# Patient Record
Sex: Male | Born: 1935 | Race: White | Hispanic: No | Marital: Married
Health system: Southern US, Community
[De-identification: ages and names within clinical notes are randomized; demographics above are authoritative.]

---

## 2004-07-18 ENCOUNTER — Ambulatory Visit: Payer: Self-pay | Admitting: Cardiology

## 2004-08-16 ENCOUNTER — Ambulatory Visit: Payer: Self-pay | Admitting: Cardiology

## 2004-08-24 ENCOUNTER — Ambulatory Visit: Payer: Self-pay | Admitting: *Deleted

## 2004-08-24 ENCOUNTER — Ambulatory Visit: Payer: Self-pay | Admitting: Cardiology

## 2004-09-07 ENCOUNTER — Ambulatory Visit: Payer: Self-pay | Admitting: Cardiology

## 2004-10-05 ENCOUNTER — Ambulatory Visit: Payer: Self-pay | Admitting: Cardiology

## 2004-11-02 ENCOUNTER — Ambulatory Visit: Payer: Self-pay | Admitting: Cardiology

## 2004-11-30 ENCOUNTER — Ambulatory Visit: Payer: Self-pay | Admitting: *Deleted

## 2004-12-29 ENCOUNTER — Ambulatory Visit: Payer: Self-pay | Admitting: Cardiology

## 2005-01-12 ENCOUNTER — Ambulatory Visit: Payer: Self-pay | Admitting: Internal Medicine

## 2005-02-09 ENCOUNTER — Ambulatory Visit: Payer: Self-pay | Admitting: Cardiology

## 2005-03-08 ENCOUNTER — Ambulatory Visit: Payer: Self-pay | Admitting: Internal Medicine

## 2005-03-28 ENCOUNTER — Ambulatory Visit: Payer: Self-pay | Admitting: Cardiology

## 2005-04-25 ENCOUNTER — Ambulatory Visit: Payer: Self-pay | Admitting: Cardiology

## 2005-05-25 ENCOUNTER — Ambulatory Visit: Payer: Self-pay | Admitting: Cardiology

## 2005-06-19 ENCOUNTER — Ambulatory Visit: Payer: Self-pay | Admitting: Cardiology

## 2005-06-19 ENCOUNTER — Ambulatory Visit: Payer: Self-pay

## 2005-07-10 ENCOUNTER — Ambulatory Visit: Payer: Self-pay | Admitting: Internal Medicine

## 2005-07-24 ENCOUNTER — Ambulatory Visit: Payer: Self-pay | Admitting: Cardiology

## 2005-08-07 ENCOUNTER — Ambulatory Visit: Payer: Self-pay | Admitting: Cardiology

## 2005-08-21 ENCOUNTER — Ambulatory Visit: Payer: Self-pay | Admitting: Cardiology

## 2005-09-18 ENCOUNTER — Ambulatory Visit: Payer: Self-pay | Admitting: Cardiology

## 2005-10-16 ENCOUNTER — Ambulatory Visit: Payer: Self-pay | Admitting: Internal Medicine

## 2005-10-20 ENCOUNTER — Ambulatory Visit: Payer: Self-pay | Admitting: Cardiology

## 2005-10-26 ENCOUNTER — Ambulatory Visit: Admission: RE | Admit: 2005-10-26 | Discharge: 2005-10-26 | Payer: Self-pay | Admitting: Cardiology

## 2005-11-13 ENCOUNTER — Ambulatory Visit: Payer: Self-pay | Admitting: Cardiology

## 2005-12-11 ENCOUNTER — Ambulatory Visit: Payer: Self-pay | Admitting: Cardiology

## 2005-12-25 ENCOUNTER — Ambulatory Visit: Payer: Self-pay | Admitting: Internal Medicine

## 2006-01-22 ENCOUNTER — Ambulatory Visit: Payer: Self-pay | Admitting: Internal Medicine

## 2006-02-19 ENCOUNTER — Ambulatory Visit: Payer: Self-pay | Admitting: Cardiology

## 2006-03-12 ENCOUNTER — Ambulatory Visit: Payer: Self-pay | Admitting: Internal Medicine

## 2006-04-09 ENCOUNTER — Ambulatory Visit: Payer: Self-pay | Admitting: Internal Medicine

## 2006-05-08 ENCOUNTER — Ambulatory Visit: Payer: Self-pay | Admitting: Cardiovascular Disease

## 2006-05-08 ENCOUNTER — Ambulatory Visit: Payer: Self-pay | Admitting: Cardiology

## 2006-06-05 ENCOUNTER — Ambulatory Visit: Payer: Self-pay | Admitting: *Deleted

## 2006-07-03 ENCOUNTER — Ambulatory Visit: Payer: Self-pay | Admitting: Cardiology

## 2006-07-18 ENCOUNTER — Ambulatory Visit: Payer: Self-pay | Admitting: Cardiology

## 2006-07-18 ENCOUNTER — Ambulatory Visit: Payer: Self-pay | Admitting: Internal Medicine

## 2006-08-08 ENCOUNTER — Ambulatory Visit: Payer: Self-pay | Admitting: Cardiology

## 2006-09-06 ENCOUNTER — Ambulatory Visit: Payer: Self-pay | Admitting: Cardiology

## 2006-10-04 ENCOUNTER — Ambulatory Visit: Payer: Self-pay | Admitting: Cardiology

## 2006-10-09 ENCOUNTER — Ambulatory Visit: Payer: Self-pay | Admitting: Cardiology

## 2006-11-01 ENCOUNTER — Ambulatory Visit: Payer: Self-pay | Admitting: *Deleted

## 2006-11-29 ENCOUNTER — Ambulatory Visit: Payer: Self-pay | Admitting: Cardiology

## 2007-01-01 ENCOUNTER — Ambulatory Visit: Payer: Self-pay | Admitting: Cardiology

## 2007-01-01 LAB — CONVERTED CEMR LAB
Calcium: 9.9 mg/dL (ref 8.4–10.5)
Chloride: 106 meq/L (ref 96–112)
GFR calc Af Amer: 56 mL/min
GFR calc non Af Amer: 46 mL/min
Magnesium: 2.4 mg/dL (ref 1.5–2.5)
Sodium: 140 meq/L (ref 135–145)

## 2011-01-20 NOTE — Assessment & Plan Note (Signed)
[facility]                            CARDIOLOGY OFFICE NOTE   NAME:Cesar Richardson, Cesar Richardson                        MRN:          [id number]  DATE:01/01/2007                            DOB:          Nov 04, 1935    PRIMARY CARE PHYSICIAN:  Dr. Elana Alm. Rostand in [place].   PRIMARY CARDIOLOGIST:  Everardo Beals. Juanda Chance, MD, [facility]   PATIENT PROFILE:  A Cesar Richardson, [name] with prior history of  atrial fibrillation and SVT ablation, who presents with recent onset of  irregular heart rhythm and skipped beats.   PROBLEM LIST:  1. History of paroxysmal atrial fibrillation on amiodarone therapy.  2. History of SVT status post ablation at [facility] in 2005.  3. Hypertension.  4. Hyperlipidemia.  5. History of normal echo and Myoview scans.  6. History of elevated PSA with negative prostate biopsy.   HISTORY OF PRESENT ILLNESS:  A Cesar Richardson, [name] with the  above problem list who was last seen in clinic in February of 2008.  He  was in his usual state of health until approximately ten days ago when  while resting after dinner he noted his heart skipping beats without  associated symptoms, lasting 15 minutes and resolving spontaneously.  Since then, he has had symptoms almost daily with the exception of one  day, almost exclusively occurring at night after dinner, lasting 15 to  20 minutes, and resolving spontaneously.  Yesterday afternoon, symptoms  presented as skipped beats with heart rates in the 70's, lasting three  to four hours and resolving spontaneously.  He denied any chest pain,  shortness of breath, PND, orthopnea, dizziness, syncope, edema, or early  satiety.  Notably, his previous symptom during periods of prolonged  atrial fibrillation was weakness, which he has not yet experienced.  Because of the frequency of these palpitations, although rate  controlled, the patient presented today for evaluation.   CURRENT  MEDICATIONS:  1. Amiodarone 100 mg daily.  2. Lisinopril 40 mg two tabs daily.  3. Warfarin as directed.  4. Hydrochlorothiazide 25 mg daily.  5. Amlodipine 10 mg daily.  6. Lipitor 10 mg daily.   PHYSICAL EXAMINATION:  VITAL SIGNS:  Blood pressure 148/83, 150/85 on  repeat.  Heart rate 78.  Respirations 16.  Weight is 210 pounds.  GENERAL:  Pleasant, white male in no acute distress.  Awake, alert, and  oriented x3.  NECK:  No bruits, JVD.  LUNGS:  Respirations regular, unlabored.  Clear to auscultation.  CARDIAC:  Regular S1, S2.  No S3, S4, murmurs.  ABDOMEN:  Round, soft, nontender, nondistended.  Bowel sounds present  x4.  EXTREMITIES:  Warm, dry, pink.  No cyanosis, clubbing, or edema.  Dorsalis pedis, posterior tibial pulses 2+ and equal bilaterally.   ACCESSORY CLINICAL FINDINGS:  EKG shows sinus rhythm with a first degree  AV block and a right bundle branch block but otherwise no acute ST or T  changes.  A basic metabolic panel, magnesium, TSH, echo, and 48-hour  Holter are pending.   ASSESSMENT/PLAN:  1. Palpitations.  The patient  reports at least a ten day history of      almost daily sensation of irregular heartbeats and skipped beats.      It is not clear if this is atrial fibrillation or not as he has no      associated symptoms and previously has had weakness during periods      of atrial fibrillation.  We will obtain basic metabolic panel,      magnesium, TSH, 48-hour Holter, and echocardiogram, and arrange for      followup in the next two to three weeks with Dr. Juanda Chance.  2. Paroxysmal atrial fibrillation.  The patient had previously been      well maintained on amiodarone until recently when he started having      palpitations.  He is in sinus rhythm today.  It is not clear      whether or not he is having some more paroxysmal atrial      fibrillation, or either premature atrial contractions or premature      ventricular contractions.  Hopefully, the Holter  monitor will help      to determine what is going on.  He remains on Coumadin therapy      otherwise.  3. Hypertension.  Blood pressure is elevated today.  He is on high      dose lisinopril as well as hydrochlorothiazide and amlodipine.  He      says that normally at home or during other medical doctor visits,      his pressure runs in the 120s to 130s.  I asked him to keep a log      of his blood pressures over the next week and to bring this with      him on his next visit as he may require additional      antihypertensives.  In the setting of palpitations, could consider      adding a beta blocker.  4. Hyperlipidemia.  He remains on statin therapy, which he tolerates      well.  He did have lipids in February of 2008, showing a total      cholesterol of 186 and an LDL of 103.  He does not have a history      of coronary disease.  5. Disposition:  Lab work, echocardiogram, and Holter as above.      Follow up with Dr. Juanda Chance in two to three weeks.      Nicolasa Ducking, ANP  Electronically Signed      Luis Abed, MD, [facility]  Electronically Signed   CB/MedQ  DD: 01/01/2007  DT: 01/01/2007  Job #: [id number]

## 2011-01-20 NOTE — Assessment & Plan Note (Signed)
[facility]                           ELECTROPHYSIOLOGY OFFICE NOTE   NAME:Cesar Richardson, Cesar Richardson                        MRN:          [id number]  DATE:07/18/2006                            DOB:          1936/06/25    Cesar Richardson is referred today by Dr. Charlies Constable for additional evaluation  for atrial fibrillation and consideration for catheter ablation.   HISTORY OF PRESENT ILLNESS:  The patient is a very pleasant, 75 year old man  with a history atrial fibrillation dating back intermittently into his 63s.  The patient states though that in the last 2 years, his episodes of atrial  fibrillation have increased in frequency and severity.  His history is that  back in 2005, he underwent catheter ablation of a right atrial tachycardia  by Dr. __________ in [place].  The patient, however, developed atrial  fibrillation and it is unclear how he did not end up following up with Dr.  __________ but is on amiodarone and on just low-dose of amiodarone, has had  no symptoms of atrial fibrillation and has tolerated this medication quite  nicely.  He denies chest pain, shortness of breath, or other symptoms from  his amiodarone therapy.   PAST MEDICAL HISTORY:  Notable for hypertension and dyslipidemia with his  hypertension being fairly severe.   MEDICATIONS:  Include:  1. Amiodarone 100 daily.  2. Lisinopril 40 mg daily.  3. Warfarin as directed.  4. Hydrochlorothiazide 25 daily.  5. Amlodipine 10 mg daily.  6. Lipitor 10 mg daily.   FAMILY HISTORY:  Notable for a brother with atrial fibrillation who recently  underwent ablation in [place].  He has a father who died at age 73 and a  mother who died at 59 of cancer.  He has 2 sisters with diabetes.   SOCIAL HISTORY:  The patient is a retired Licensed conveyancer, lives in  [place].  His son-in-law is a Teacher, early years/pre at [employer].  He  denies tobacco or ethanol abuse.   REVIEW OF SYSTEMS:   Negative except as noted in the HPI.   PHYSICAL EXAM:  He is a pleasant, well-appearing, 75 year old man in no  acute distress.  The blood pressure today was 142/78. The pulse 78 and  regular.  The respirations were 18.  Weight was 200 pounds.  HEENT:  Normal.  NECK:  No jugular venous distention.  There is no thyromegaly.  The trachea  was midline. The carotids were 2+ and symmetric.  There was no thyromegaly.  LUNGS:  Clear bilaterally to auscultation.  There are no wheezes, rales, or  rhonchi.  There is no increased work of breathing.  CARDIOVASCULAR:  Regular rate and rhythm with normal S1 and a split S2,  which was physiologically split.  There were no obvious murmurs.  The PMI  was not enlarged nor was it laterally displaced.  ABDOMEN:  Soft, nontender, nondistended.  There was no organomegaly.  The  bowel sounds are present.  There is no rebound or guarding.  EXTREMITIES:  No cyanosis, clubbing, or edema.  The pulses were 2+ and  symmetric.  NEUROLOGIC:  Alert and oriented x3 with cranial nerves intact.  Strength was  5/5 and symmetric.  SKIN:  Normal.  JOINT: Normal.   The EKG demonstrates sinus rhythm with a right bundle branch block and QRS  duration of 150 msec with first-degree AV block.   IMPRESSION:  1. Paroxysmal atrial fibrillation.  2. History of atrial tachycardia status post ablation.  3. Severe hypertension, now controlled on multiple medications.  4. Chronic Coumadin therapy.   DISCUSSION:  Today we spent a lot of time talking about the treatment  options with Cesar Richardson, specifically consideration for catheter ablation of  his atrial fibrillation.  Because the patient has tolerated amiodarone very  nicely and because he has not had any recurrent symptomatic atrial  fibrillation on very loses of amiodarone, I would recommend a continued  period of amiodarone therapy rather than proceeding with ablation.  We will  plan to see him back as needed.     Doylene Canning. Ladona Ridgel, MD  Electronically Signed    GWT/MedQ  DD: 07/18/2006  DT: 07/18/2006  Job #: [id number]   cc:   Rocky Morel

## 2011-01-20 NOTE — Assessment & Plan Note (Signed)
[facility]                            CARDIOLOGY OFFICE NOTE   NAME:Cesar Richardson, Cesar Richardson                        MRN:          [id number]  DATE:10/09/2006                            DOB:          [birth year]    PRIMARY CARE PHYSICIAN:  Molly Maduro A. Daryll Brod, MD in [facility]   CLINICAL HISTORY:  Cesar Richardson is 75 years old and has a history of SVT  and paroxysmal atrial fibrillation. He had an ablation procedure in [facility] for the SVT and his atrial fibrillation has now been controlled on  amiodarone. He has been evaluated for underlying heart disease with a  Cardiolite scan and echocardiogram, both of which were normal.   He says he has done quite well over the past year. He just has  occasional palpitations, but nothing like the atrial fibrillation that  he had before. He has had no chest pain or shortness of breath.   PAST MEDICAL HISTORY:  Is significant for hypertension and  hyperlipidemia.   CURRENT MEDICATIONS:  1. Amiodarone.  2. Lisinopril.  3. Warfarin.  4. Hydrochlorothiazide.  5. Amlodipine.  6. Lipitor.   HE HAS BEEN INTOLERANT TO PRAVACHOL.   PHYSICAL EXAMINATION:  Blood pressure is 151/88, pulse is 74 and  regular. There was no venous distention. Carotid pulses were full  without bruits.  CHEST: Was clear.  CARDIAC: Rhythm was regular. Had no murmurs or gallops.  ABDOMEN: Soft, without organomegaly.  Peripheral pulses were full and there is no peripheral edema.   No electrocardiogram was done since he had one done last November.   IMPRESSION:  1. Paroxysmal atrial fibrillation, controlled on amiodarone.  2. History of previous supraventricular tachycardia, status post      ablation at [facility] in 2005.  3. Normal echocardiogram and Myoview scans.  4. Hypertension.  5. Hyperlipidemia.  6. Elevated PSA with negative prostate biopsy.   RECOMMENDATIONS:  I think Cesar Richardson is doing quite well. Will plan to  get blood  studies for amiodarone surveillance at [facility] office if  this is possible. Will get a TSH and liver function test along with his  cholesterol that was already planned. Also, he had a BNP and CBC. Will  get another TSH and liver function test in six months and I will see him  back in a year.   ADDENDUM:  Dr. Lewayne Richardson saw Cesar Richardson last November for an EP opinion  regarding possible atrial fibrillation ablation. This was done at the  family's request. Dr. Ladona Richardson felt that since he was controlled very well  on low dose amiodarone that he did not recommend an attempt at atrial  fibrillation ablation and recommended continuing his current therapy.     Cesar Elvera Lennox Juanda Chance, MD, [facility]  Electronically Signed    BRB/MedQ  DD: 10/09/2006  DT: 10/09/2006  Job #: [id number]

## 2019-04-02 ENCOUNTER — Other Ambulatory Visit (HOSPITAL_COMMUNITY): Payer: Self-pay | Admitting: Urology

## 2019-04-02 DIAGNOSIS — D136 Benign neoplasm of pancreas: Secondary | ICD-10-CM

## 2019-04-15 ENCOUNTER — Other Ambulatory Visit (HOSPITAL_COMMUNITY): Payer: Self-pay | Admitting: Urology

## 2019-04-15 DIAGNOSIS — D136 Benign neoplasm of pancreas: Secondary | ICD-10-CM

## 2019-04-16 ENCOUNTER — Ambulatory Visit (HOSPITAL_COMMUNITY)
Admission: RE | Admit: 2019-04-16 | Discharge: 2019-04-16 | Disposition: A | Discharge status: Home / Self Care | Payer: Medicare Other | Source: Ambulatory Visit | Attending: Urology | Admitting: Urology

## 2019-04-16 ENCOUNTER — Other Ambulatory Visit: Payer: Self-pay

## 2019-04-16 DIAGNOSIS — D136 Benign neoplasm of pancreas: Secondary | ICD-10-CM | POA: Insufficient documentation

## 2019-04-16 LAB — POCT I-STAT CREATININE: Creatinine, Ser: 1.1 mg/dL (ref 0.61–1.24)

## 2019-04-16 MED ORDER — GADOBUTROL 1 MMOL/ML IV SOLN
10.0000 mL | Freq: Once | INTRAVENOUS | Status: AC | PRN
  Administered 2019-04-16: 10 mL via INTRAVENOUS

## 2019-09-16 ENCOUNTER — Ambulatory Visit: Payer: Medicare Other | Attending: Internal Medicine

## 2019-09-16 DIAGNOSIS — Z23 Encounter for immunization: Secondary | ICD-10-CM | POA: Insufficient documentation

## 2019-09-16 NOTE — Progress Notes (Signed)
   Covid-19 Vaccination Clinic  Name:  Cesar Richardson    MRN: [id number] DOB: 03/22/1936  09/16/2019  Cesar Richardson was observed post Covid-19 immunization for 30 minutes based on pre-vaccination screening without incidence. He was provided with Vaccine Information Sheet and instruction to access the V-Safe system.   Cesar Richardson was instructed to call 911 with any severe reactions post vaccine: [name] Kitchen Difficulty breathing  . Swelling of your face and throat  . A fast heartbeat  . A bad rash all over your body  . Dizziness and weakness    Immunizations Administered    Name Date Dose VIS Date Route   Pfizer COVID-19 Vaccine 09/16/2019 12:25 PM 0.3 mL 08/15/2019 Intramuscular   Manufacturer: [place]   Lot: [id number]   [place]: [id number]

## 2019-10-04 ENCOUNTER — Ambulatory Visit: Payer: Medicare Other | Attending: Internal Medicine

## 2019-10-04 DIAGNOSIS — Z23 Encounter for immunization: Secondary | ICD-10-CM | POA: Insufficient documentation

## 2019-10-04 NOTE — Progress Notes (Signed)
   Covid-19 Vaccination Clinic  Name:  Cesar Richardson    MRN: [id number] DOB: [birth year]  10/04/2019  Mr. [name] was observed post Covid-19 immunization for 15 minutes without incidence. He was provided with Vaccine Information Sheet and instruction to access the V-Safe system.   Mr. Cord was instructed to call 911 with any severe reactions post vaccine: [name] Kitchen Difficulty breathing  . Swelling of your face and throat  . A fast heartbeat  . A bad rash all over your body  . Dizziness and weakness    Immunizations Administered    Name Date Dose VIS Date Route   Pfizer COVID-19 Vaccine 10/04/2019 12:14 PM 0.3 mL 08/15/2019 Intramuscular   Manufacturer: [place]   Lot: [id number]   [place]: [id number]

## 2021-03-16 IMAGING — MR MR 3D RECON AT SCANNER
9 of 15 series · 22 of 48 positions shown · IV contrast (gadavist)
Comparison: CT the abdomen and pelvis 03/14/2019.

CLINICAL DATA: 83-year-old male with history of cystic pancreatic
lesion. Follow-up study.

EXAM:
MRI ABDOMEN WITHOUT AND WITH CONTRAST
TECHNIQUE: Multiplanar multisequence MR imaging of the abdomen was performed
both before and after the administration of intravenous contrast.
CONTRAST:  10 mL of Gadavist.

[Series 3: T2 fat-sat · axial · 5.0mm · 0.78mm/px · 1 of 53 slices shown]
[im 1/53]
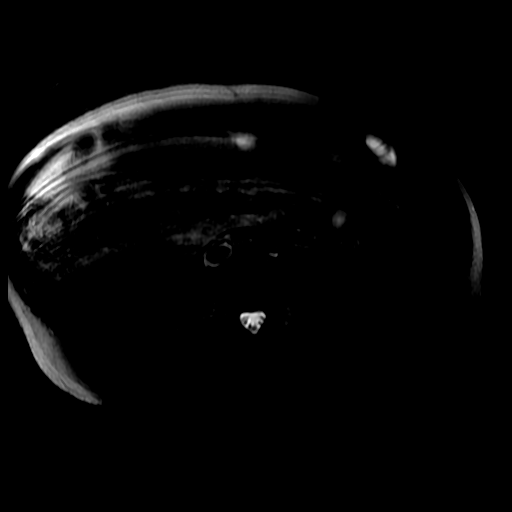

[Series 4: DWI b500 · axial · 6.0mm · 1.48mm/px · z∈[-69,+173]mm · 2 of 64 slices shown]
[im 1/64]
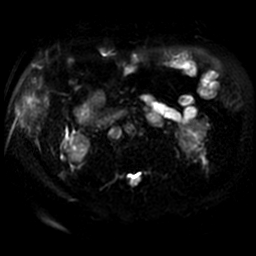
[im 64/64]
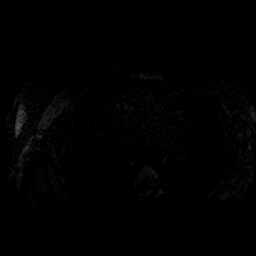

[Series 5: T2 · axial · 5.0mm · 0.78mm/px · z∈[-82,+153]mm · 2 of 48 slices shown (1 of 2)]
[im 1/48]
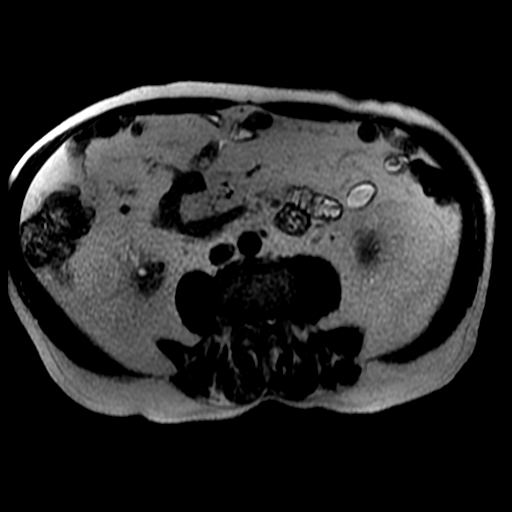
[im 48/48]
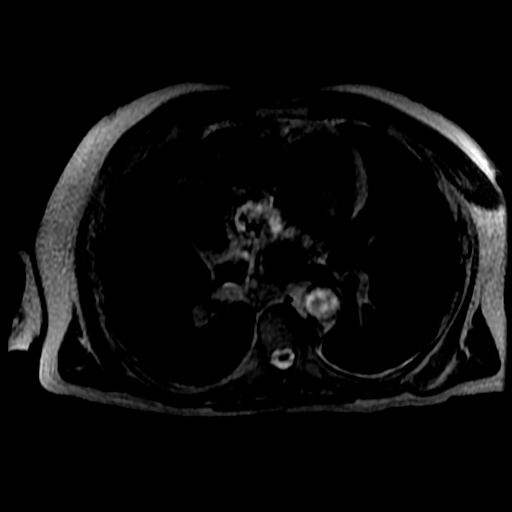

[Series 7: bSSFP · coronal · 5.0mm · 0.82mm/px · 2 of 47 slices shown]
[im 1/47]
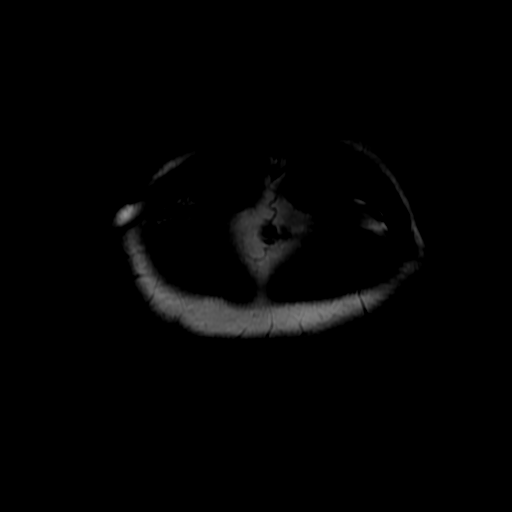
[im 47/47]
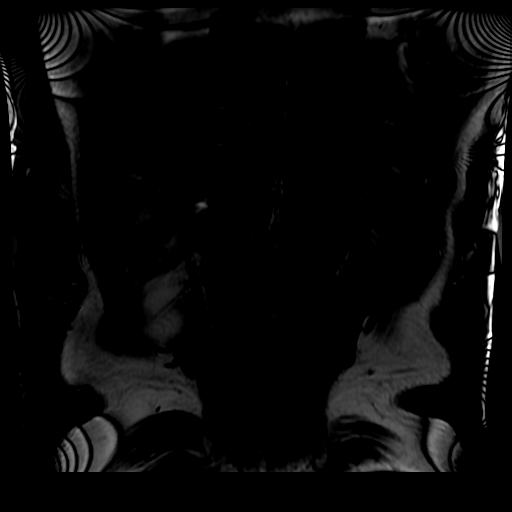

[Series 8: T2 · coronal · 5.0mm · 0.80mm/px · 2 of 49 slices shown (2 of 2)]
[im 1/49]
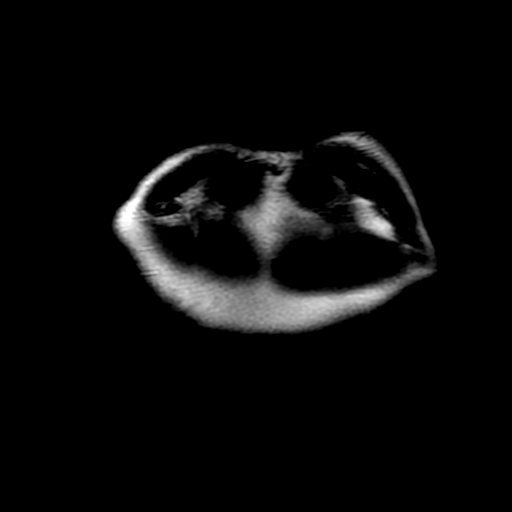
[im 49/49]
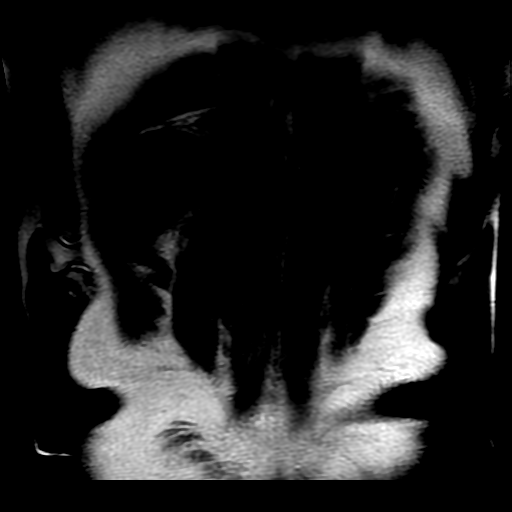

[Series 9: ax dualecho bh · axial · 5.0mm · 1.56mm/px · z∈[-82,+153]mm · 4 of 96 slices shown]
[im 1/96]
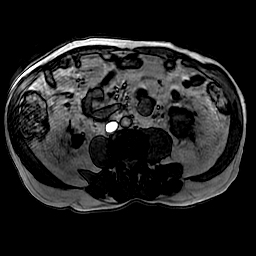
[im 32/96]
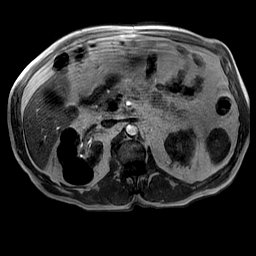
[im 64/96]
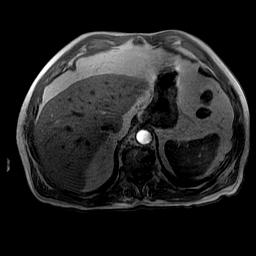
[im 96/96]
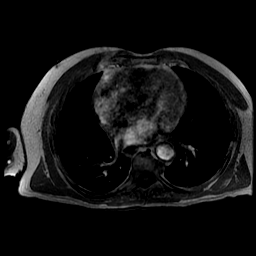

[Series 12: MRCP · axial · 40.0mm · 0.70mm/px · 1 of 6 slices shown]
[im 1/6]
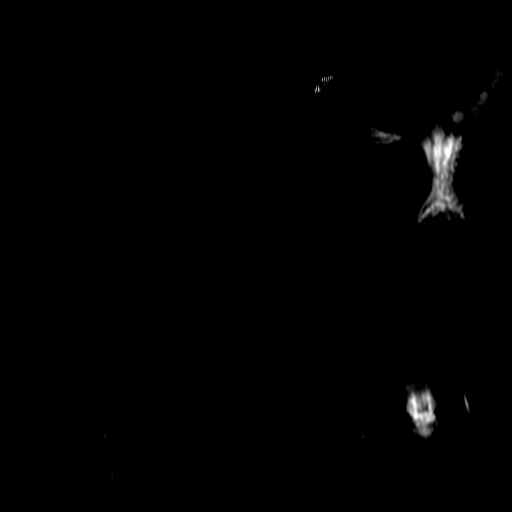

[Series 1300: T1 dynamic · axial · 5.0mm · 0.78mm/px · z∈[-84,+133]mm · 4 of 88 slices shown (1 of 2)]
[im 1/88]
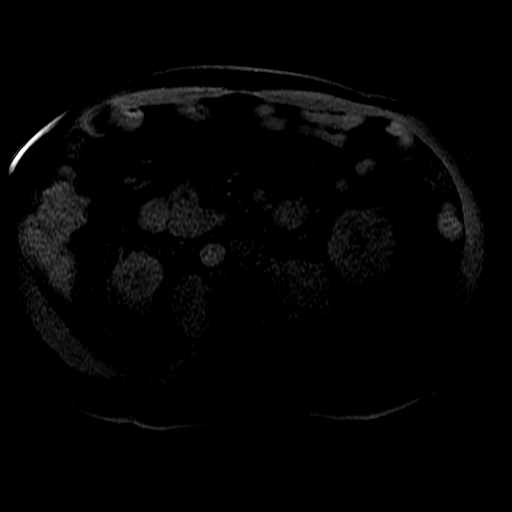
[im 30/88]
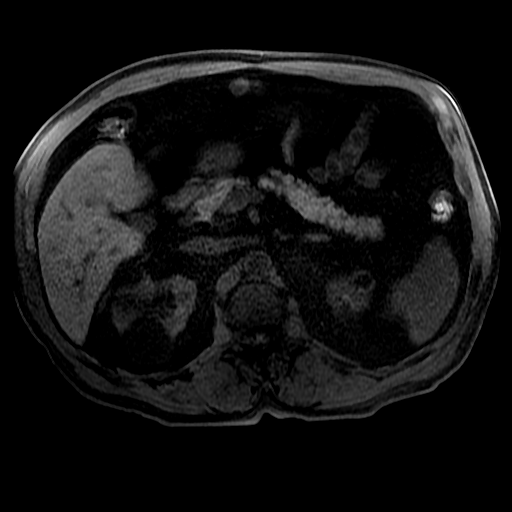
[im 59/88]
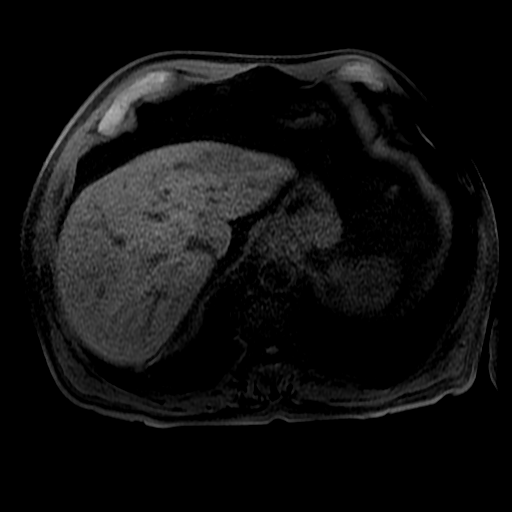
[im 88/88]
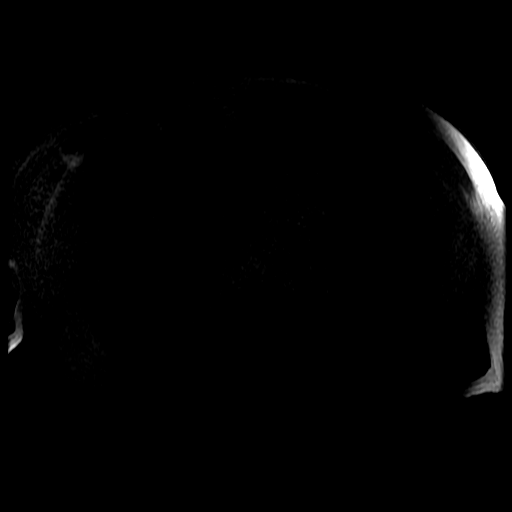

[Series 1301: T1 dynamic · axial · 5.0mm · 0.78mm/px · z∈[-84,+133]mm · 4 of 88 slices shown (2 of 2)]
[im 1/88]
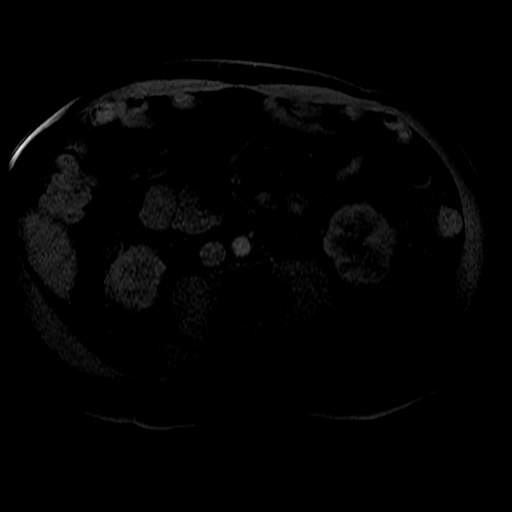
[im 30/88]
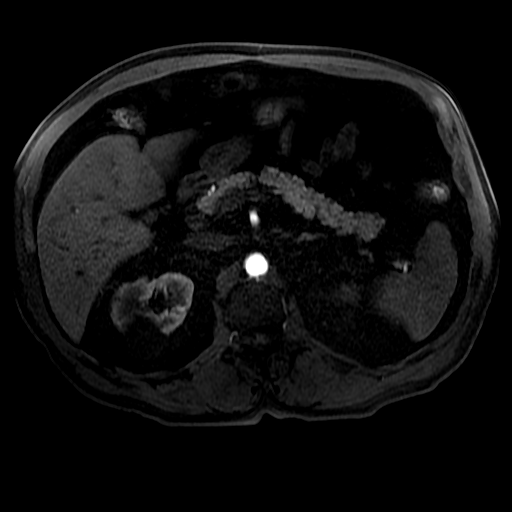
[im 59/88]
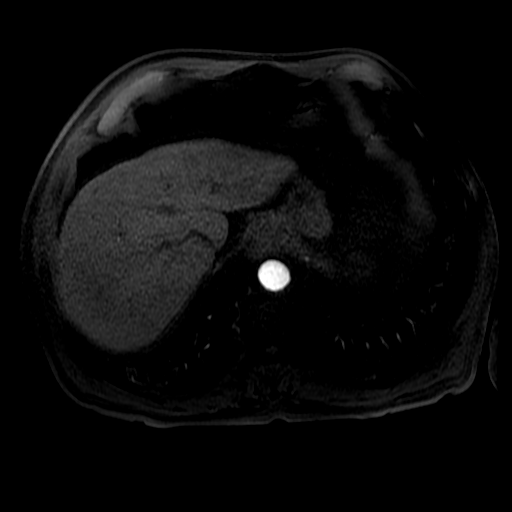
[im 88/88]
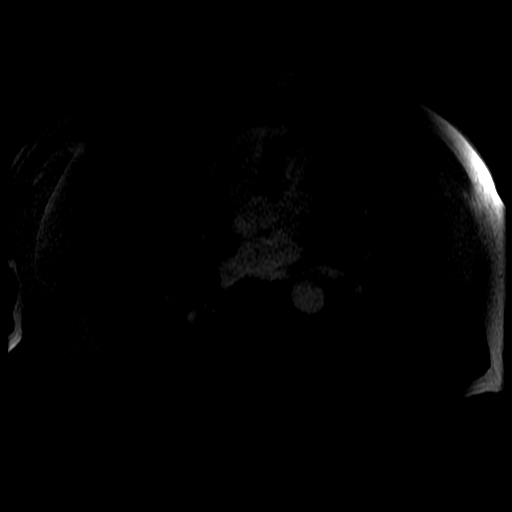

[22 of 48 positions shown; findings below may reference images not displayed]

FINDINGS: Lower chest: Small hiatal hernia.

Hepatobiliary: Mild diffuse loss of signal intensity throughout the
hepatic parenchyma on out of phase dual echo images, indicative of
hepatic steatosis. Small T1 hypointense, T2 hyperintense,
nonenhancing lesions in segment 6 of the liver measuring up to 9 mm,
compatible with small simple cysts. No suspicious hepatic lesions.
No intra or extrahepatic biliary ductal dilatation noted on MRCP
images. Gallbladder is normal in appearance.

Pancreas: In the superior aspect of the head of the pancreas there
is a 1.5 cm T1 hypointense, T2 hyperintense, nonenhancing lesion
(axial image 33 of series 5), favored to represent a benign
pancreatic pseudocyst. No other pancreatic mass. No pancreatic
ductal dilatation noted on MRCP images. No peripancreatic fluid
collections or inflammatory changes.

Spleen:  Unremarkable.

Adrenals/Urinary Tract: Multiple T1 hypointense, T2 hyperintense,
nonenhancing lesions noted in the kidneys bilaterally measuring up
to 5 cm in the upper pole of the right kidney. In addition, in the
upper pole of the right kidney there is a 7 mm T1 hyperintense, T2
hyperintense, nonenhancing lesion medially, compatible with a
proteinaceous/hemorrhagic cyst. No suspicious renal lesions. No
hydroureteronephrosis in the visualized portions of the abdomen.
Bilateral adrenal glands are normal in appearance.

Stomach/Bowel: Visualized portions are unremarkable.

Vascular/Lymphatic: Aortic atherosclerosis, without evidence of
aneurysm in the abdominal vasculature. No lymphadenopathy noted in
the abdomen.

Other: No significant volume of ascites noted in the visualized
portions of the peritoneal cavity.

Musculoskeletal: No aggressive appearing osseous lesions are noted
in the visualized portions of the skeleton.
IMPRESSION: 1. The lesion of concern in the superior aspect of the head of the
pancreas has imaging characteristics suggestive of a benign lesion,
likely a pancreatic pseudocyst. Repeat abdominal MRI with and
without IV gadolinium with MRCP is recommended in 2 years to ensure
continued stability. This recommendation follows ACR consensus
guidelines: Management of Incidental Pancreatic Cysts: A White Paper
of the ACR Incidental Findings Committee. [HOSPITAL]
5515;[DATE].
2. Mild hepatic steatosis.
3. Aortic atherosclerosis.
4. Additional incidental findings, as above.

## 2021-04-29 ENCOUNTER — Other Ambulatory Visit (HOSPITAL_COMMUNITY): Payer: Self-pay | Admitting: General Surgery

## 2021-04-29 ENCOUNTER — Other Ambulatory Visit (HOSPITAL_BASED_OUTPATIENT_CLINIC_OR_DEPARTMENT_OTHER): Payer: Self-pay | Admitting: General Surgery

## 2021-04-29 ENCOUNTER — Other Ambulatory Visit: Payer: Self-pay | Admitting: General Surgery

## 2021-04-29 DIAGNOSIS — K862 Cyst of pancreas: Secondary | ICD-10-CM
